# Patient Record
Sex: Male | Born: 1995 | Race: Black or African American | Hispanic: No | Marital: Single | Smoking: Never smoker
Health system: Southern US, Community
[De-identification: ages and names within clinical notes are randomized; demographics above are authoritative.]

## PROBLEM LIST (undated history)

## (undated) HISTORY — PX: OTHER SURGICAL HISTORY: SHX169

---
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
## 2017-10-06 ENCOUNTER — Encounter (HOSPITAL_BASED_OUTPATIENT_CLINIC_OR_DEPARTMENT_OTHER): Payer: Self-pay | Admitting: *Deleted

## 2017-10-06 ENCOUNTER — Emergency Department (HOSPITAL_BASED_OUTPATIENT_CLINIC_OR_DEPARTMENT_OTHER)
Admission: EM | Admit: 2017-10-06 | Discharge: 2017-10-06 | Disposition: A | Discharge status: Home / Self Care | Payer: BLUE CROSS/BLUE SHIELD | Attending: Physician Assistant | Admitting: Physician Assistant

## 2017-10-06 DIAGNOSIS — Z711 Person with feared health complaint in whom no diagnosis is made: Secondary | ICD-10-CM

## 2017-10-06 DIAGNOSIS — Z202 Contact with and (suspected) exposure to infections with a predominantly sexual mode of transmission: Secondary | ICD-10-CM | POA: Insufficient documentation

## 2017-10-06 NOTE — ED Triage Notes (Signed)
Pt reports that he wants to be checked for STDs.  States that he has had bumps 'down there' since he was in prison 2 months ago-reports that he was seen and treated for the bumps and was told that he needed to stop 'jacking' and it would improve. Denies penile discharge, pain.

## 2017-10-06 NOTE — Discharge Instructions (Signed)
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  We sent off your HIV, syphilis, gonorrhea and chlamydia.  They will call you with positive results.
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
## 2017-10-07 LAB — HIV ANTIBODY (ROUTINE TESTING W REFLEX): HIV SCREEN 4TH GENERATION: NONREACTIVE

## 2017-10-07 LAB — RPR: RPR Ser Ql: NONREACTIVE

## 2017-10-08 LAB — GC/CHLAMYDIA PROBE AMP (~~LOC~~) NOT AT ARMC
CHLAMYDIA, DNA PROBE: NEGATIVE
NEISSERIA GONORRHEA: NEGATIVE

## 2017-12-07 ENCOUNTER — Encounter (HOSPITAL_COMMUNITY): Payer: Self-pay | Admitting: Emergency Medicine

## 2017-12-07 ENCOUNTER — Emergency Department (HOSPITAL_COMMUNITY)
Admission: EM | Admit: 2017-12-07 | Discharge: 2017-12-07 | Disposition: A | Discharge status: Home / Self Care | Payer: Self-pay | Attending: Emergency Medicine | Admitting: Emergency Medicine

## 2017-12-07 ENCOUNTER — Emergency Department (HOSPITAL_COMMUNITY): Payer: Self-pay

## 2017-12-07 ENCOUNTER — Other Ambulatory Visit: Payer: Self-pay

## 2017-12-07 DIAGNOSIS — S60151A Contusion of right little finger with damage to nail, initial encounter: Secondary | ICD-10-CM | POA: Insufficient documentation

## 2017-12-07 DIAGNOSIS — Y939 Activity, unspecified: Secondary | ICD-10-CM | POA: Insufficient documentation

## 2017-12-07 DIAGNOSIS — Y929 Unspecified place or not applicable: Secondary | ICD-10-CM | POA: Insufficient documentation

## 2017-12-07 DIAGNOSIS — S6010XA Contusion of unspecified finger with damage to nail, initial encounter: Secondary | ICD-10-CM

## 2017-12-07 DIAGNOSIS — W230XXA Caught, crushed, jammed, or pinched between moving objects, initial encounter: Secondary | ICD-10-CM | POA: Insufficient documentation

## 2017-12-07 DIAGNOSIS — Y990 Civilian activity done for income or pay: Secondary | ICD-10-CM | POA: Insufficient documentation

## 2017-12-07 MED ORDER — OXYCODONE-ACETAMINOPHEN 5-325 MG PO TABS
1.0000 | ORAL_TABLET | Freq: Once | ORAL | Status: AC
  Administered 2017-12-07: 1 via ORAL
  Filled 2017-12-07: qty 1

## 2017-12-07 NOTE — ED Triage Notes (Signed)
Pt "smashed my finger while trying to straighten up the wood" at work around noon.  Nail bed of pinky finger on the right hand is bluish in color and "its throbbing."

## 2017-12-07 NOTE — ED Provider Notes (Signed)
[facility] EMERGENCY DEPARTMENT Provider Note   CSN: [id number] Arrival date & time: 12/07/17  1808     History   Chief Complaint Chief Complaint  Patient presents with  . Finger Injury    HPI Salif Tay is a 22 y.o. male.  Pt "smashed my finger while trying to straighten up the wood" at work around noon.  Nail bed of pinky finger on the right hand is bluish in color and "its throbbing."  Immunizations are up-to-date.  No lacerations noted.  With   The history is provided by the patient. No language interpreter was used.  Hand Pain  This is a new problem. The current episode started 3 to 5 hours ago. The problem occurs constantly. The problem has not changed since onset.Pertinent negatives include no chest pain, no abdominal pain, no headaches and no shortness of breath. The symptoms are aggravated by bending. Nothing relieves the symptoms.    History reviewed. No pertinent past medical history.  There are no active problems to display for this patient.   Past Surgical History:  Procedure Laterality Date  . arm surgery     dog bite to right arm at age 49       Home Medications    Prior to Admission medications   Not on File    Family History No family history on file.  Social History Social History   Tobacco Use  . Smoking status: Never Smoker  Substance Use Topics  . Alcohol use: No  . Drug use: No     Allergies   Patient has no known allergies.   Review of Systems Review of Systems  Respiratory: Negative for shortness of breath.   Cardiovascular: Negative for chest pain.  Gastrointestinal: Negative for abdominal pain.  Neurological: Negative for headaches.  All other systems reviewed and are negative.    Physical Exam Updated Vital Signs BP (!) 141/76 (BP Location: Right Arm)   Temp 98.7 F (37.1 C) (Oral)   Resp 16   Ht 6' (1.829 m)   Wt 84.4 kg (186 lb)   SpO2 100%   BMI 25.23 kg/m   Physical Exam    Constitutional: He is oriented to person, place, and time. He appears well-developed and well-nourished.  HENT:  Head: Normocephalic.  Right Ear: External ear normal.  Left Ear: External ear normal.  Mouth/Throat: Oropharynx is clear and moist.  Eyes: Conjunctivae and EOM are normal.  Neck: Normal range of motion. Neck supple.  Cardiovascular: Normal rate, normal heart sounds and intact distal pulses.  Pulmonary/Chest: Effort normal and breath sounds normal.  Abdominal: Soft. Bowel sounds are normal.  Musculoskeletal: Normal range of motion.  Neurological: He is alert and oriented to person, place, and time.  Skin: Skin is warm and dry.  Right pinky finger with almost 100% subungual hematoma noted.  No lacerations noted.  Nursing note and vitals reviewed.    ED Treatments / Results  Labs (all labs ordered are listed, but only abnormal results are displayed) Labs Reviewed - No data to display  EKG  EKG Interpretation None       Radiology Dg Finger Little Right  Result Date: 12/07/2017 CLINICAL DATA:  Crush injury with pain at tip of finger. EXAM: RIGHT LITTLE FINGER 2+V COMPARISON:  None. FINDINGS: There is no evidence of fracture or dislocation. There is no evidence of arthropathy or other focal bone abnormality. Soft tissues are unremarkable. IMPRESSION: Negative. Electronically Signed   By: Jamison Oka.D.  On: 12/07/2017 20:10    Procedures .Nail Removal Date/Time: 12/07/2017 10:20 PM Performed by: Niel HummerKuhner, Leylany Nored, MD Authorized by: Niel HummerKuhner, Vickie Melnik, MD   Consent:    Consent obtained:  Verbal   Consent given by:  Patient   Risks discussed:  Bleeding Location:    Hand:  R small finger Pre-procedure details:    Preparation: Patient was prepped and draped in the usual sterile fashion   Anesthesia (see MAR for exact dosages):    Anesthesia method:  None Trephination:    Subungual hematoma drained: yes     Trephination instrument:  Cautery Post-procedure details:     Dressing:  4x4 sterile gauze, antibiotic ointment and gauze roll   Patient tolerance of procedure:  Tolerated well, no immediate complications Comments:     Successful trephination of subungual hematoma and.  Patient feeling better after drainage of blood.   (including critical care time)  Medications Ordered in ED Medications  oxyCODONE-acetaminophen (PERCOCET/ROXICET) 5-325 MG per tablet 1 tablet (1 tablet Oral Given 12/07/17 1922)     Initial Impression / Assessment and Plan / ED Course  I have reviewed the triage vital signs and the nursing notes.  Pertinent labs & imaging results that were available during my care of the patient were reviewed by me and considered in my medical decision making (see chart for details).     22 year old who had his pinky smashed by wood.  Subungual hematoma noted.  Subungual hematoma drained after trephination with excellent results.  X-rays obtained and visualized by me show no fracture.   Discussed wound healing.  Discussed signs that warrant reevaluation.  Final Clinical Impressions(s) / ED Diagnoses   Final diagnoses:  Subungual hematoma of digit of hand, initial encounter    ED Discharge Orders    None       Niel HummerKuhner, Saidy Ormand, MD 12/07/17 2221

## 2018-01-01 ENCOUNTER — Encounter (HOSPITAL_BASED_OUTPATIENT_CLINIC_OR_DEPARTMENT_OTHER): Payer: Self-pay | Admitting: *Deleted

## 2018-01-01 ENCOUNTER — Other Ambulatory Visit: Payer: Self-pay

## 2018-01-01 ENCOUNTER — Emergency Department (HOSPITAL_BASED_OUTPATIENT_CLINIC_OR_DEPARTMENT_OTHER)
Admission: EM | Admit: 2018-01-01 | Discharge: 2018-01-01 | Disposition: A | Discharge status: Home / Self Care | Payer: Self-pay | Attending: Emergency Medicine | Admitting: Emergency Medicine

## 2018-01-01 DIAGNOSIS — Z202 Contact with and (suspected) exposure to infections with a predominantly sexual mode of transmission: Secondary | ICD-10-CM | POA: Insufficient documentation

## 2018-01-01 LAB — RAPID HIV SCREEN (HIV 1/2 AB+AG)
HIV 1/2 Antibodies: NONREACTIVE
HIV-1 P24 Antigen - HIV24: NONREACTIVE

## 2018-01-01 MED ORDER — CEFTRIAXONE SODIUM 250 MG IJ SOLR
250.0000 mg | Freq: Once | INTRAMUSCULAR | Status: AC
  Administered 2018-01-01: 250 mg via INTRAMUSCULAR
  Filled 2018-01-01: qty 250

## 2018-01-01 MED ORDER — AZITHROMYCIN 250 MG PO TABS
1000.0000 mg | ORAL_TABLET | Freq: Once | ORAL | Status: AC
  Administered 2018-01-01: 1000 mg via ORAL
  Filled 2018-01-01: qty 4

## 2018-01-01 MED ORDER — ONDANSETRON 4 MG PO TBDP
4.0000 mg | ORAL_TABLET | Freq: Once | ORAL | Status: AC
  Administered 2018-01-01: 4 mg via ORAL
  Filled 2018-01-01: qty 1
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
## 2018-01-01 NOTE — ED Provider Notes (Signed)
[facility] EMERGENCY DEPARTMENT Provider Note   CSN: [id number] Arrival date & time: 01/01/18  0909     History   Chief Complaint Chief Complaint  Patient presents with  . Exposure to STD    HPI Oscar Johnson is a 22 y.o. male.  HPI  Oscar Johnson is a 22yo male with no significant past medical history who presents to the emergency department for STD exposure.  Patient states that his sexual partner told him that she had chlamydia and that he needed to be tested.  He had unprotected sex with her recently.  States that he has had about 5 male sexual partners in the past 6 months, denies regular condom use.  Denies any symptoms.  Denies fever, chills, abdominal pain, nausea/vomiting, dysuria, urinary frequency, penile discharge, testicular pain/swelling, painful bowel movements.   History reviewed. No pertinent past medical history.  There are no active problems to display for this patient.   Past Surgical History:  Procedure Laterality Date  . arm surgery     dog bite to right arm at age 89       Home Medications    Prior to Admission medications   Not on File    Family History History reviewed. No pertinent family history.  Social History Social History   Tobacco Use  . Smoking status: Never Smoker  . Smokeless tobacco: Never Used  Substance Use Topics  . Alcohol use: No  . Drug use: No     Allergies   Patient has no known allergies.   Review of Systems Review of Systems  Constitutional: Negative for chills, fatigue and fever.  Respiratory: Negative for shortness of breath.   Cardiovascular: Negative for chest pain.  Gastrointestinal: Negative for abdominal pain, nausea and vomiting.  Genitourinary: Negative for discharge, dysuria, frequency, penile swelling, scrotal swelling and testicular pain.  Musculoskeletal: Negative for back pain.  Skin: Negative for rash.  Neurological: Negative for headaches.  Psychiatric/Behavioral: Negative  for agitation.  All other systems reviewed and are negative.    Physical Exam Updated Vital Signs BP 120/62 (BP Location: Right Arm)   Pulse 62   Temp 98.3 F (36.8 C) (Oral)   Resp 18   Ht 6' (1.829 m)   Wt 84.4 kg (186 lb)   SpO2 100%   BMI 25.23 kg/m   Physical Exam  Constitutional: He is oriented to person, place, and time. He appears well-developed and well-nourished. No distress.  HENT:  Head: Normocephalic and atraumatic.  Eyes: Right eye exhibits no discharge. Left eye exhibits no discharge.  Cardiovascular: Normal rate, regular rhythm and intact distal pulses. Exam reveals no friction rub.  No murmur heard. Pulmonary/Chest: Effort normal and breath sounds normal. No stridor. No respiratory distress. He has no wheezes. He has no rales.  Abdominal: Soft. Bowel sounds are normal. There is no tenderness. There is no guarding.  Genitourinary:  Genitourinary Comments: Chaperone present for exam. No discharge from penis. No signs of lesion or erythema on the penis or testicles. The penis and testicles are nontender. No testicular masses or swelling.   Musculoskeletal: Normal range of motion.  Neurological: He is alert and oriented to person, place, and time. Coordination normal.  Skin: Skin is warm and dry. He is not diaphoretic.  Psychiatric: He has a normal mood and affect. His behavior is normal.  Nursing note and vitals reviewed.    ED Treatments / Results  Labs (all labs ordered are listed, but only abnormal results are displayed)  Labs Reviewed  RAPID HIV SCREEN (HIV 1/2 AB+AG)  RPR  GC/CHLAMYDIA PROBE AMP ([place] [address]) NOT AT [facility]    EKG  EKG Interpretation None       Radiology No results found.  Procedures Procedures (including critical care time)  Medications Ordered in ED Medications  azithromycin (ZITHROMAX) tablet 1,000 mg (1,000 mg Oral Given 01/01/18 1008)  ondansetron (ZOFRAN-ODT) disintegrating tablet 4 mg (4 mg Oral Given 01/01/18 1008)    cefTRIAXone (ROCEPHIN) injection 250 mg (250 mg Intramuscular Given 01/01/18 1008)     Initial Impression / Assessment and Plan / ED Course  I have reviewed the triage vital signs and the nursing notes.  Pertinent labs & imaging results that were available during my care of the patient were reviewed by me and considered in my medical decision making (see chart for details).    Pt presents with exposure to STD.  Pt understands that he has GC/Chlamydia, HIV, syphilis lab work pending and will need to inform all sexual partners if results return positive. Pt has been treated prophylactically with azithromycin and Rocephin. Patient to be discharged with instructions to follow up with health department for further STD concerns. Discussed importance of using protection when sexually active. Patient agrees and voices understanding to the above plan.    Final Clinical Impressions(s) / ED Diagnoses   Final diagnoses:  STD exposure    ED Discharge Orders    None       Kellie Shropshire, PA-C 01/01/18 0954    Kellie Shropshire, PA-C 01/01/18 1011    Rolan Bucco, MD 01/01/18 1250

## 2018-01-01 NOTE — ED Notes (Signed)
NAD at this time. Pt is stable and going home.  

## 2018-01-01 NOTE — Discharge Instructions (Signed)
You have chlamydia, gonorrhea, HIV and syphilis testing pending. It will take 2-3days for all of the results to return.   If positive, you will get a phone call and will need to inform all sexual partners. You were treated for Chlamydia and Gonorrhea in the ER today.   Please go to the health department for any future STD concerns.

## 2018-01-02 LAB — GC/CHLAMYDIA PROBE AMP (~~LOC~~) NOT AT ARMC
Chlamydia: NEGATIVE
Neisseria Gonorrhea: NEGATIVE

## 2018-01-02 LAB — RPR: RPR Ser Ql: NONREACTIVE

## 2018-02-17 IMAGING — DX DG FINGER LITTLE 2+V*R*
3 series · 3 of 3 positions shown · non-contrast
Comparison: None.

CLINICAL DATA: Crush injury with pain at tip of finger.

EXAM:
RIGHT LITTLE FINGER 2+V

[finger ap]
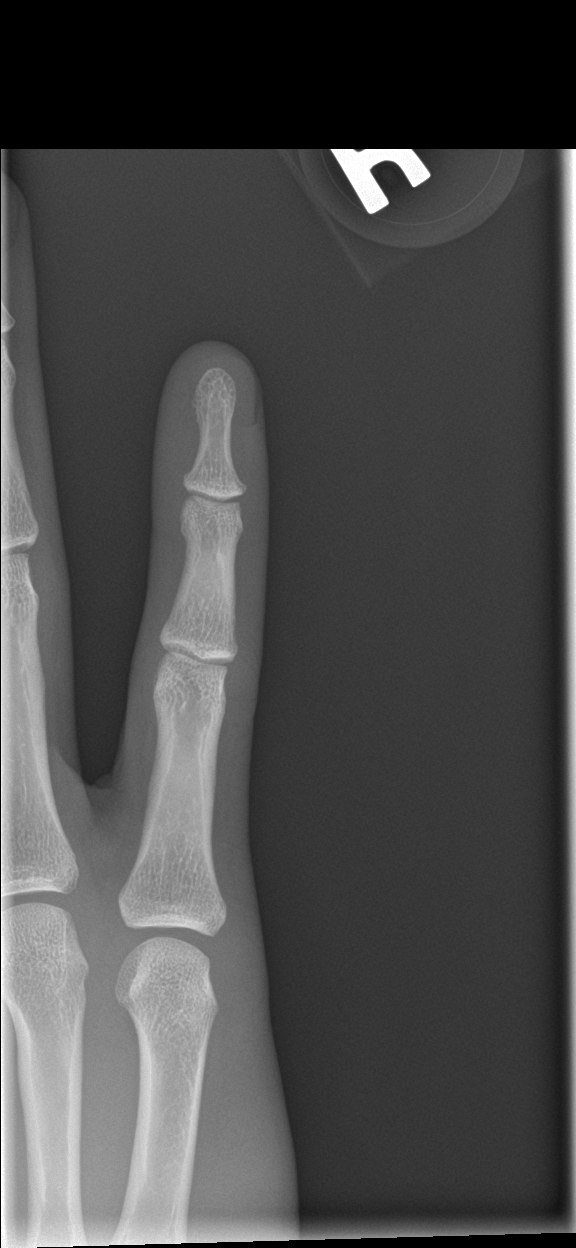

[finger obl]
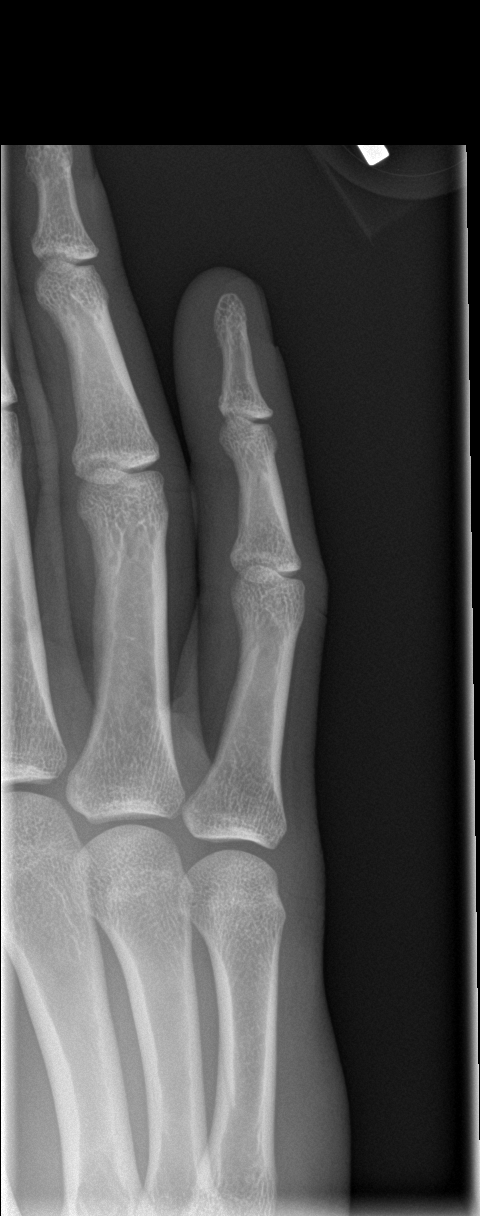

[finger lat]
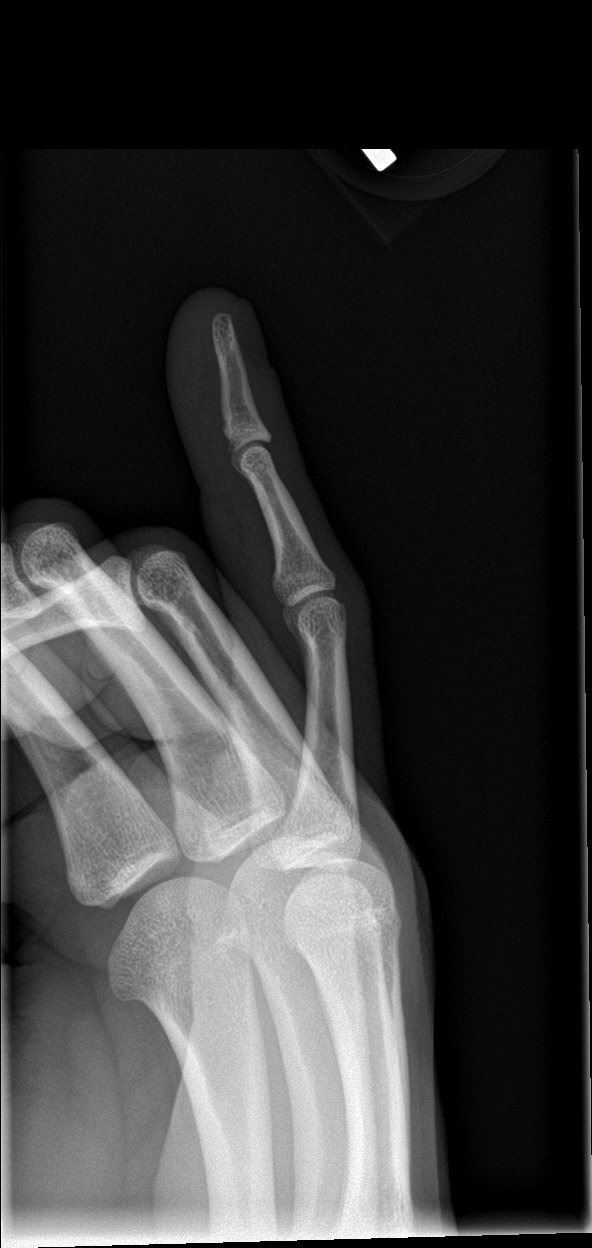

[3 of 3 positions shown; findings below may reference images not displayed]

FINDINGS: There is no evidence of fracture or dislocation. There is no
evidence of arthropathy or other focal bone abnormality. Soft
tissues are unremarkable.
IMPRESSION: Negative.
